# Patient Record
Sex: Female | Born: 1975 | Hispanic: Yes | Marital: Single
Health system: Southern US, Community
[De-identification: ages and names within clinical notes are randomized; demographics above are authoritative.]

---

## 2015-02-25 ENCOUNTER — Other Ambulatory Visit: Payer: Self-pay | Admitting: Obstetrics and Gynecology

## 2015-02-25 DIAGNOSIS — R928 Other abnormal and inconclusive findings on diagnostic imaging of breast: Secondary | ICD-10-CM

## 2015-03-11 ENCOUNTER — Ambulatory Visit
Admission: RE | Admit: 2015-03-11 | Discharge: 2015-03-11 | Disposition: A | Discharge status: Home / Self Care | Payer: BC Managed Care – PPO | Source: Ambulatory Visit | Attending: Obstetrics and Gynecology | Admitting: Obstetrics and Gynecology

## 2015-03-11 DIAGNOSIS — R928 Other abnormal and inconclusive findings on diagnostic imaging of breast: Secondary | ICD-10-CM

## 2016-03-28 ENCOUNTER — Other Ambulatory Visit: Payer: Self-pay | Admitting: Obstetrics and Gynecology

## 2016-03-28 DIAGNOSIS — R928 Other abnormal and inconclusive findings on diagnostic imaging of breast: Secondary | ICD-10-CM

## 2016-03-31 ENCOUNTER — Ambulatory Visit
Admission: RE | Admit: 2016-03-31 | Discharge: 2016-03-31 | Disposition: A | Discharge status: Home / Self Care | Payer: BC Managed Care – PPO | Source: Ambulatory Visit | Attending: Obstetrics and Gynecology | Admitting: Obstetrics and Gynecology

## 2016-03-31 DIAGNOSIS — R928 Other abnormal and inconclusive findings on diagnostic imaging of breast: Secondary | ICD-10-CM

## 2017-02-26 ENCOUNTER — Other Ambulatory Visit: Payer: Self-pay | Admitting: Obstetrics and Gynecology

## 2017-02-26 DIAGNOSIS — Z1231 Encounter for screening mammogram for malignant neoplasm of breast: Secondary | ICD-10-CM

## 2017-03-26 ENCOUNTER — Ambulatory Visit
Admission: RE | Admit: 2017-03-26 | Discharge: 2017-03-26 | Disposition: A | Discharge status: Home / Self Care | Payer: BC Managed Care – PPO | Source: Ambulatory Visit | Attending: Obstetrics and Gynecology | Admitting: Obstetrics and Gynecology

## 2017-03-26 DIAGNOSIS — Z1231 Encounter for screening mammogram for malignant neoplasm of breast: Secondary | ICD-10-CM

## 2018-03-04 ENCOUNTER — Other Ambulatory Visit: Payer: Self-pay | Admitting: Obstetrics and Gynecology

## 2018-03-04 DIAGNOSIS — Z1231 Encounter for screening mammogram for malignant neoplasm of breast: Secondary | ICD-10-CM

## 2018-03-28 ENCOUNTER — Ambulatory Visit
Admission: RE | Admit: 2018-03-28 | Discharge: 2018-03-28 | Disposition: A | Discharge status: Home / Self Care | Payer: BC Managed Care – PPO | Source: Ambulatory Visit | Attending: Obstetrics and Gynecology | Admitting: Obstetrics and Gynecology

## 2018-03-28 DIAGNOSIS — Z1231 Encounter for screening mammogram for malignant neoplasm of breast: Secondary | ICD-10-CM

## 2019-05-01 ENCOUNTER — Other Ambulatory Visit: Payer: Self-pay | Admitting: Obstetrics and Gynecology

## 2019-05-01 DIAGNOSIS — Z1231 Encounter for screening mammogram for malignant neoplasm of breast: Secondary | ICD-10-CM

## 2019-06-13 ENCOUNTER — Ambulatory Visit
Admission: RE | Admit: 2019-06-13 | Discharge: 2019-06-13 | Disposition: A | Discharge status: Home / Self Care | Payer: BC Managed Care – PPO | Source: Ambulatory Visit | Attending: Obstetrics and Gynecology | Admitting: Obstetrics and Gynecology

## 2019-06-13 ENCOUNTER — Other Ambulatory Visit: Payer: Self-pay

## 2019-06-13 DIAGNOSIS — Z1231 Encounter for screening mammogram for malignant neoplasm of breast: Secondary | ICD-10-CM

## 2019-12-27 ENCOUNTER — Ambulatory Visit: Payer: BC Managed Care – PPO

## 2020-01-08 ENCOUNTER — Ambulatory Visit: Payer: BC Managed Care – PPO | Attending: Family

## 2020-01-08 DIAGNOSIS — Z23 Encounter for immunization: Secondary | ICD-10-CM | POA: Insufficient documentation

## 2020-01-08 NOTE — Progress Notes (Signed)
   Covid-19 Vaccination Clinic  Name:  Nianna Igo    MRN: [id number] DOB: 1976/03/15  01/08/2020  Ms. Clendenning was observed post Covid-19 immunization for 15 minutes without incidence. She was provided with Vaccine Information Sheet and instruction to access the V-Safe system.   Ms. Kolinski was instructed to call 911 with any severe reactions post vaccine: Marland Kitchen Difficulty breathing  . Swelling of your face and throat  . A fast heartbeat  . A bad rash all over your body  . Dizziness and weakness    Immunizations Administered    Name Date Dose VIS Date Route   Moderna COVID-19 Vaccine 01/08/2020  4:20 PM 0.5 mL 10/14/2019 Intramuscular   Manufacturer: Moderna   Lot: [id number]   NDC: 47583-074-60

## 2020-02-05 ENCOUNTER — Ambulatory Visit: Payer: BC Managed Care – PPO | Attending: Family

## 2020-02-05 DIAGNOSIS — Z23 Encounter for immunization: Secondary | ICD-10-CM

## 2020-02-05 NOTE — Progress Notes (Signed)
   Covid-19 Vaccination Clinic  Name:  Tanya Zhang    MRN: [id number] DOB: Feb 20, 1976  02/05/2020  [name] was observed post Covid-19 immunization for 15 minutes without incident. She was provided with Vaccine Information Sheet and instruction to access the V-Safe system.   [name] was instructed to call 911 with any severe reactions post vaccine: Marland Kitchen Difficulty breathing  . Swelling of face and throat  . A fast heartbeat  . A bad rash all over body  . Dizziness and weakness   Immunizations Administered    Name Date Dose VIS Date Route   Moderna COVID-19 Vaccine 02/05/2020 12:54 PM 0.5 mL 10/14/2019 Intramuscular   Manufacturer: Moderna   Lot: [id number]   NDC: 35465-681-27

## 2020-02-10 ENCOUNTER — Ambulatory Visit: Payer: BC Managed Care – PPO

## 2020-04-19 ENCOUNTER — Other Ambulatory Visit: Payer: Self-pay | Admitting: Obstetrics and Gynecology

## 2020-04-19 ENCOUNTER — Other Ambulatory Visit: Payer: Self-pay | Admitting: Nurse Practitioner

## 2020-04-19 DIAGNOSIS — Z1231 Encounter for screening mammogram for malignant neoplasm of breast: Secondary | ICD-10-CM

## 2020-06-17 ENCOUNTER — Ambulatory Visit
Admission: RE | Admit: 2020-06-17 | Discharge: 2020-06-17 | Disposition: A | Discharge status: Home / Self Care | Payer: BC Managed Care – PPO | Source: Ambulatory Visit | Attending: Obstetrics and Gynecology | Admitting: Obstetrics and Gynecology

## 2020-06-17 ENCOUNTER — Other Ambulatory Visit: Payer: Self-pay

## 2020-06-17 DIAGNOSIS — Z1231 Encounter for screening mammogram for malignant neoplasm of breast: Secondary | ICD-10-CM

## 2021-05-02 ENCOUNTER — Other Ambulatory Visit: Payer: Self-pay | Admitting: Obstetrics and Gynecology

## 2021-05-02 DIAGNOSIS — Z1231 Encounter for screening mammogram for malignant neoplasm of breast: Secondary | ICD-10-CM

## 2021-06-24 ENCOUNTER — Other Ambulatory Visit: Payer: Self-pay

## 2021-06-24 ENCOUNTER — Ambulatory Visit
Admission: RE | Admit: 2021-06-24 | Discharge: 2021-06-24 | Disposition: A | Discharge status: Home / Self Care | Payer: BC Managed Care – PPO | Source: Ambulatory Visit | Attending: Obstetrics and Gynecology | Admitting: Obstetrics and Gynecology

## 2021-06-24 DIAGNOSIS — Z1231 Encounter for screening mammogram for malignant neoplasm of breast: Secondary | ICD-10-CM

## 2021-06-29 ENCOUNTER — Other Ambulatory Visit: Payer: Self-pay | Admitting: Obstetrics and Gynecology

## 2021-06-29 DIAGNOSIS — R928 Other abnormal and inconclusive findings on diagnostic imaging of breast: Secondary | ICD-10-CM

## 2021-07-22 ENCOUNTER — Other Ambulatory Visit: Payer: Self-pay | Admitting: Obstetrics and Gynecology

## 2021-07-22 ENCOUNTER — Ambulatory Visit
Admission: RE | Admit: 2021-07-22 | Discharge: 2021-07-22 | Disposition: A | Discharge status: Home / Self Care | Payer: BC Managed Care – PPO | Source: Ambulatory Visit | Attending: Obstetrics and Gynecology | Admitting: Obstetrics and Gynecology

## 2021-07-22 ENCOUNTER — Other Ambulatory Visit: Payer: Self-pay

## 2021-07-22 DIAGNOSIS — R928 Other abnormal and inconclusive findings on diagnostic imaging of breast: Secondary | ICD-10-CM

## 2021-07-22 DIAGNOSIS — R921 Mammographic calcification found on diagnostic imaging of breast: Secondary | ICD-10-CM

## 2021-08-01 ENCOUNTER — Ambulatory Visit
Admission: RE | Admit: 2021-08-01 | Discharge: 2021-08-01 | Disposition: A | Discharge status: Home / Self Care | Payer: BC Managed Care – PPO | Source: Ambulatory Visit | Attending: Obstetrics and Gynecology | Admitting: Obstetrics and Gynecology

## 2021-08-01 ENCOUNTER — Other Ambulatory Visit: Payer: Self-pay

## 2021-08-01 DIAGNOSIS — R921 Mammographic calcification found on diagnostic imaging of breast: Secondary | ICD-10-CM

## 2021-08-01 HISTORY — PX: BREAST BIOPSY: SHX20

## 2021-09-10 IMAGING — MG MM DIGITAL SCREENING BILAT W/ TOMO AND CAD
8 series · 9 of 24 positions shown · non-contrast
Comparison: Previous exam(s).

CLINICAL DATA: Screening.

EXAM:
DIGITAL SCREENING BILATERAL MAMMOGRAM WITH TOMOSYNTHESIS AND CAD
TECHNIQUE: Bilateral screening digital craniocaudal and mediolateral oblique
mammograms were obtained. Bilateral screening digital breast
tomosynthesis was performed. The images were evaluated with
computer-aided detection.

[R MLO synth-2D]
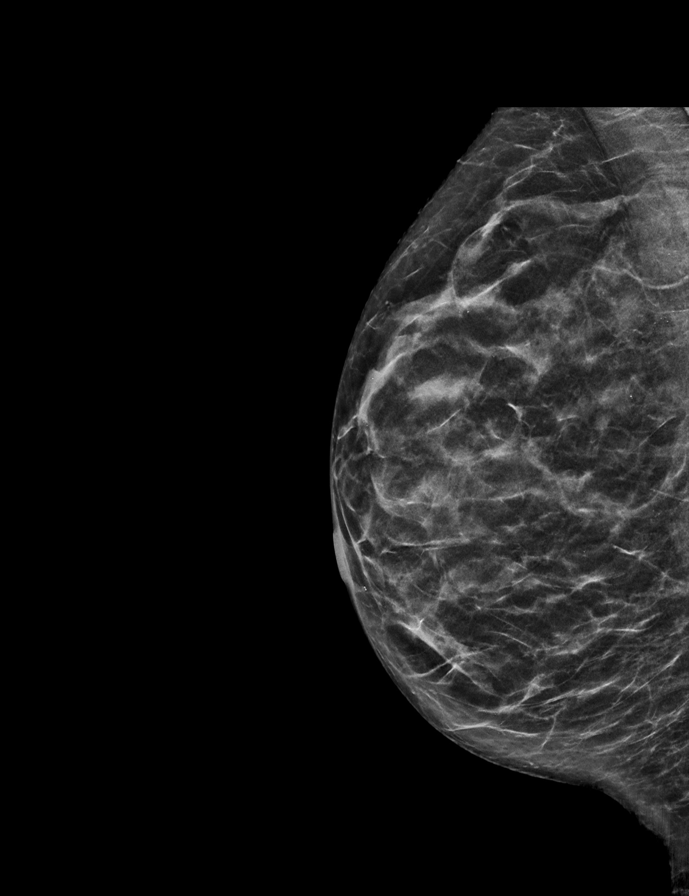

[R CC synth-2D]
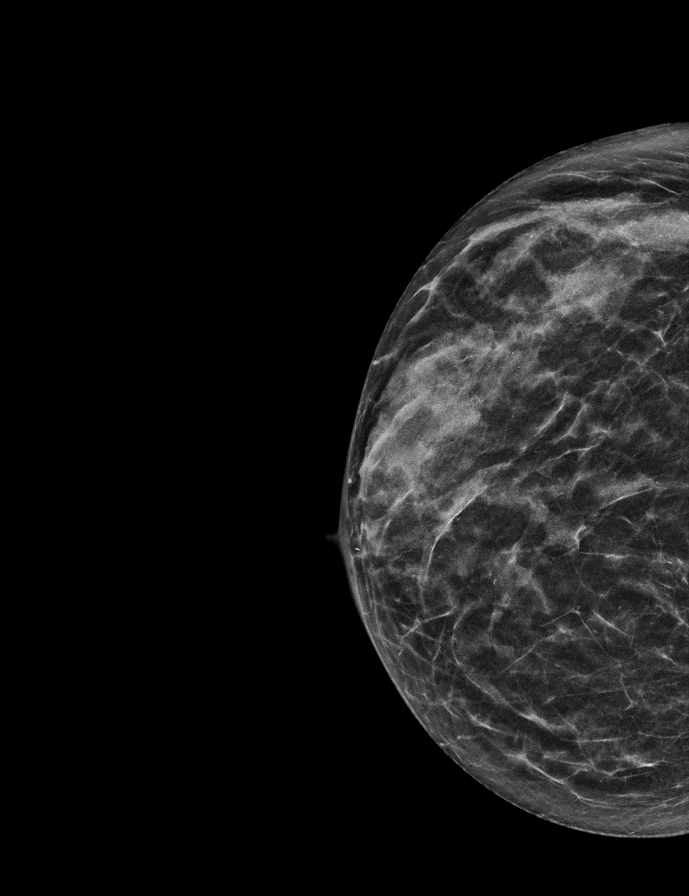

[L MLO synth-2D]
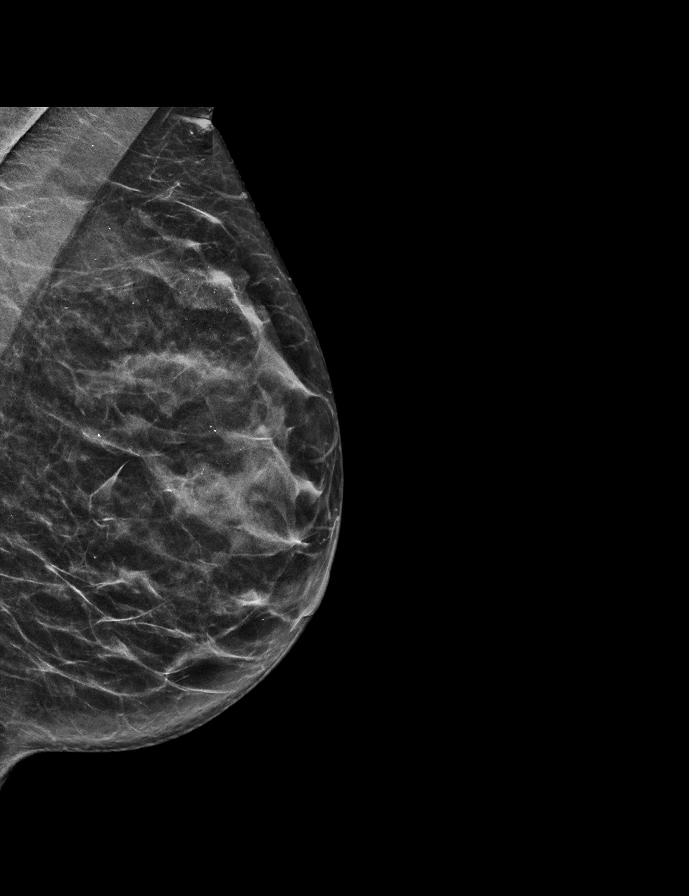

[L CC synth-2D]
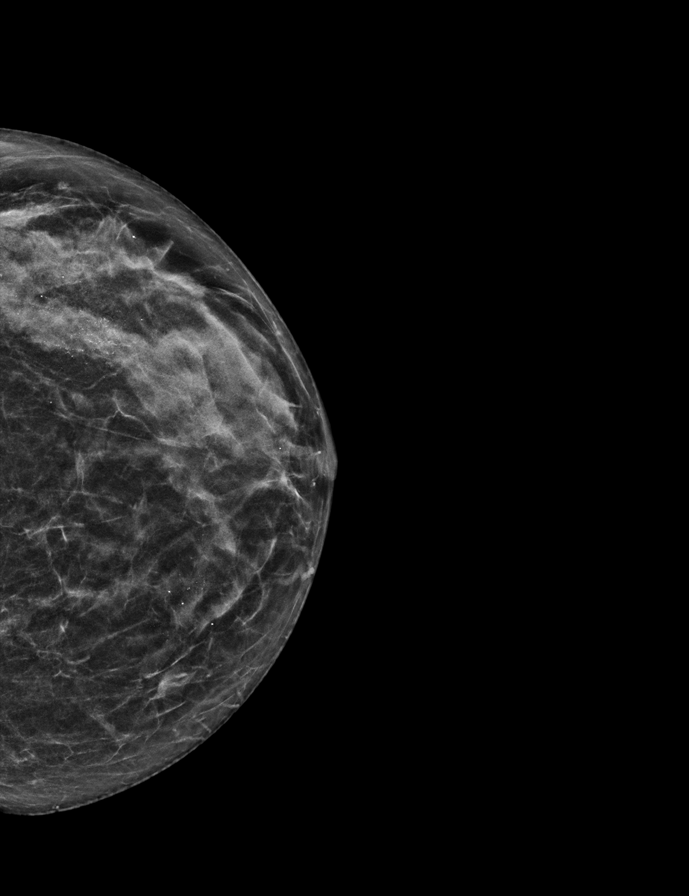

[L CC tomo · 2 of 46 frames shown]
[frame 15/46]
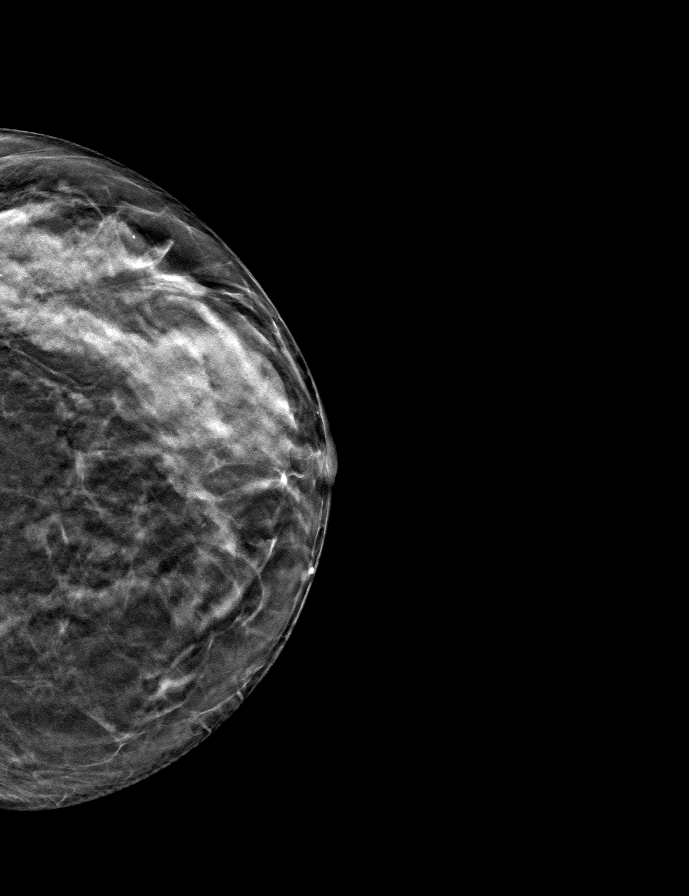
[frame 23/46]
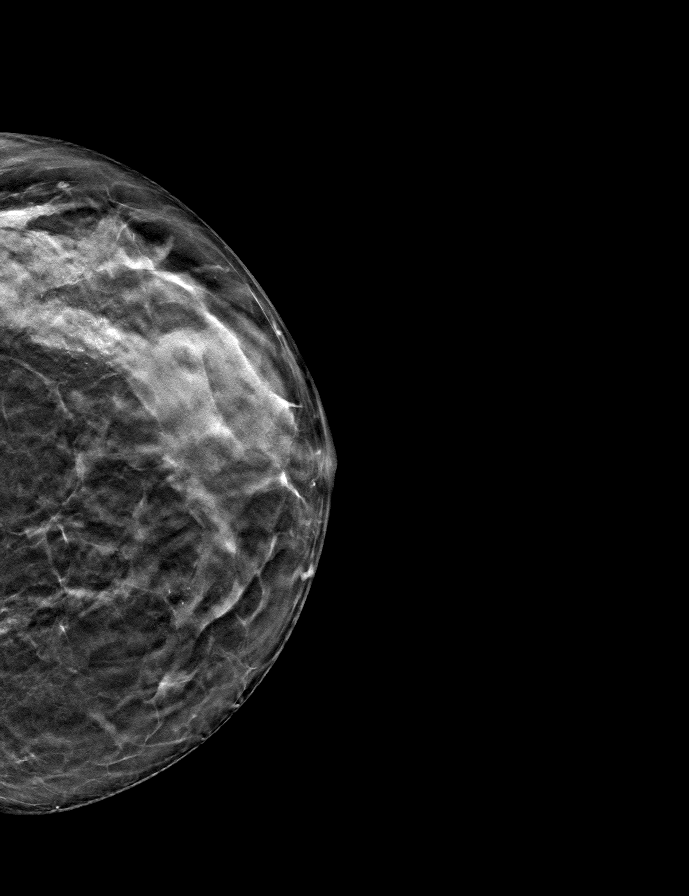

[R MLO tomo · tomo slice 25/48.0]
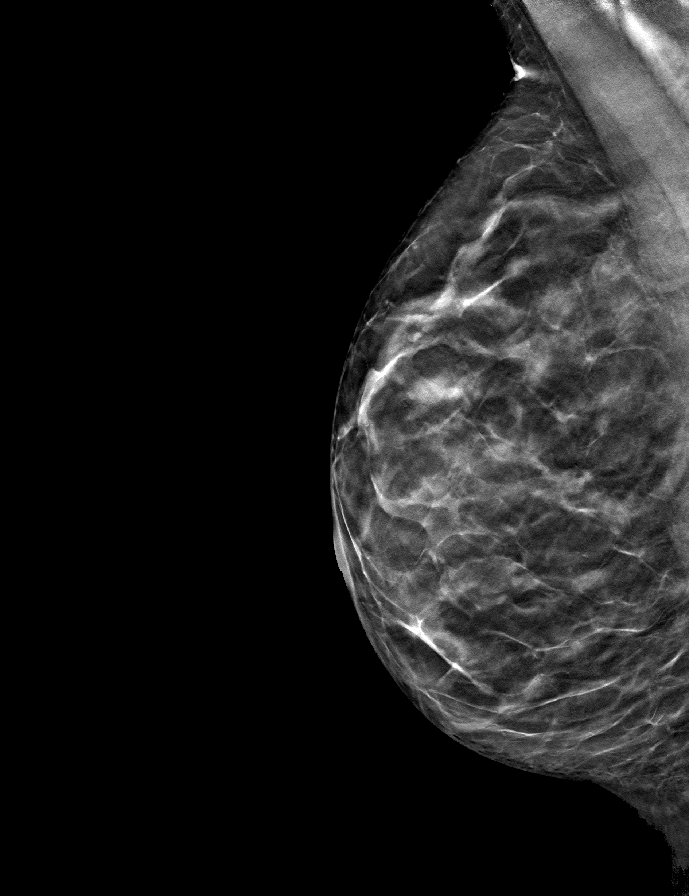

[R CC tomo · tomo slice 22/43.0]
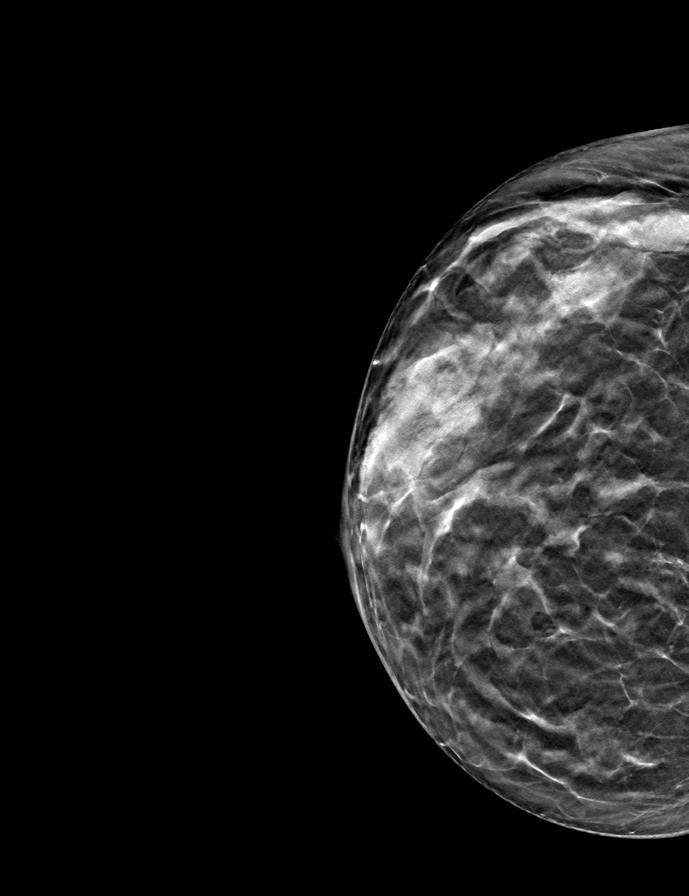

[L MLO tomo · tomo slice 26/51.0]
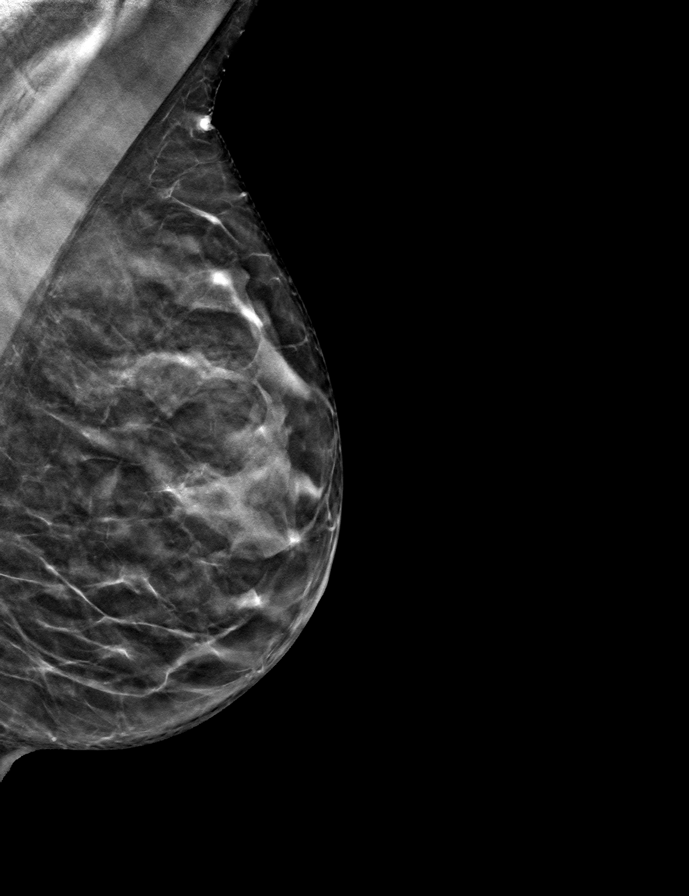

[9 of 24 positions shown; findings below may reference images not displayed]

ACR Breast Density Category c: The breast tissue is heterogeneously
dense, which may obscure small masses.
FINDINGS: In the left breast, calcifications in the upper outer breast at
middle to posterior depth warrant further evaluation. In the right
breast, no findings suspicious for malignancy.
IMPRESSION: Further evaluation is suggested for calcifications in the left
breast.

RECOMMENDATION:
Diagnostic mammogram of the left breast. (Code:[id number])

The patient will be contacted regarding the findings, and additional
imaging will be scheduled.

BI-RADS CATEGORY  0: Incomplete. Need additional imaging evaluation
and/or prior mammograms for comparison.

## 2022-05-18 ENCOUNTER — Other Ambulatory Visit: Payer: Self-pay | Admitting: Obstetrics and Gynecology

## 2022-05-18 DIAGNOSIS — Z1231 Encounter for screening mammogram for malignant neoplasm of breast: Secondary | ICD-10-CM

## 2022-06-30 ENCOUNTER — Ambulatory Visit: Payer: BC Managed Care – PPO

## 2022-06-30 ENCOUNTER — Ambulatory Visit
Admission: RE | Admit: 2022-06-30 | Discharge: 2022-06-30 | Disposition: A | Discharge status: Home / Self Care | Payer: BC Managed Care – PPO | Source: Ambulatory Visit | Attending: Obstetrics and Gynecology | Admitting: Obstetrics and Gynecology

## 2022-06-30 DIAGNOSIS — Z1231 Encounter for screening mammogram for malignant neoplasm of breast: Secondary | ICD-10-CM

## 2023-05-18 ENCOUNTER — Other Ambulatory Visit: Payer: Self-pay | Admitting: Internal Medicine

## 2023-05-18 DIAGNOSIS — Z1231 Encounter for screening mammogram for malignant neoplasm of breast: Secondary | ICD-10-CM

## 2023-07-06 ENCOUNTER — Ambulatory Visit
Admission: RE | Admit: 2023-07-06 | Discharge: 2023-07-06 | Disposition: A | Discharge status: Home / Self Care | Payer: BC Managed Care – PPO | Source: Ambulatory Visit | Attending: Internal Medicine | Admitting: Internal Medicine

## 2023-07-06 DIAGNOSIS — Z1231 Encounter for screening mammogram for malignant neoplasm of breast: Secondary | ICD-10-CM

## 2024-05-30 ENCOUNTER — Other Ambulatory Visit: Payer: Self-pay | Admitting: Obstetrics and Gynecology

## 2024-05-30 DIAGNOSIS — Z1231 Encounter for screening mammogram for malignant neoplasm of breast: Secondary | ICD-10-CM

## 2024-07-11 ENCOUNTER — Ambulatory Visit
Admission: RE | Admit: 2024-07-11 | Discharge: 2024-07-11 | Disposition: A | Discharge status: Home / Self Care | Payer: Self-pay | Source: Ambulatory Visit | Attending: Obstetrics and Gynecology | Admitting: Obstetrics and Gynecology

## 2024-07-11 DIAGNOSIS — Z1231 Encounter for screening mammogram for malignant neoplasm of breast: Secondary | ICD-10-CM
